# Patient Record
Sex: Male | Born: 1990 | Hispanic: No | Marital: Single | Smoking: Never smoker
Health system: Southern US, Community
[De-identification: ages and names within clinical notes are randomized; demographics above are authoritative.]

## PROBLEM LIST (undated history)

## (undated) DIAGNOSIS — J45909 Unspecified asthma, uncomplicated: Secondary | ICD-10-CM

## (undated) DIAGNOSIS — M109 Gout, unspecified: Secondary | ICD-10-CM

---
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
## 2017-01-05 ENCOUNTER — Encounter: Payer: Self-pay | Admitting: Emergency Medicine

## 2017-01-05 ENCOUNTER — Emergency Department (INDEPENDENT_AMBULATORY_CARE_PROVIDER_SITE_OTHER)
Admission: EM | Admit: 2017-01-05 | Discharge: 2017-01-05 | Disposition: A | Discharge status: Home / Self Care | Payer: Managed Care, Other (non HMO) | Source: Home / Self Care | Attending: Emergency Medicine | Admitting: Emergency Medicine

## 2017-01-05 ENCOUNTER — Other Ambulatory Visit: Payer: Self-pay

## 2017-01-05 ENCOUNTER — Emergency Department (INDEPENDENT_AMBULATORY_CARE_PROVIDER_SITE_OTHER): Payer: Managed Care, Other (non HMO)

## 2017-01-05 DIAGNOSIS — M25562 Pain in left knee: Secondary | ICD-10-CM

## 2017-01-05 LAB — POCT CBC W AUTO DIFF (K'VILLE URGENT CARE)

## 2017-01-05 MED ORDER — COLCHICINE 0.6 MG PO TABS: ORAL_TABLET | ORAL | 0 refills | Status: DC

## 2017-01-05 MED ORDER — KETOROLAC TROMETHAMINE 60 MG/2ML IM SOLN: 60.0000 mg | Freq: Once | INTRAMUSCULAR | Status: DC

## 2017-01-05 MED ORDER — HYDROCODONE-ACETAMINOPHEN 5-325 MG PO TABS: 1.0000 | ORAL_TABLET | Freq: Four times a day (QID) | ORAL | 0 refills | Status: DC | PRN

## 2017-01-05 NOTE — Discharge Instructions (Signed)
Please make an appointment to follow-up with Dr. Karie Schwalbe. Take medications as instructed. Only take the prescribed medicine colchicine. You can also take 2 extra strength Tylenol every 8 hours. Do not take the ibuprofen at present.

## 2017-01-05 NOTE — ED Provider Notes (Addendum)
[facility]    CSN: [id number] Arrival date & time: 01/05/17  1136     History   Chief Complaint Chief Complaint  Patient presents with  . Knee Pain    HPI Theodore Larson is a 26 y.o. male.   HPI Patient presents with severe pain in his left knee. He has pain with any movement of the knee. He is not ill. He does not have any fevers chills or other symptoms. He had a similar episode about one month ago. This resolved with topical cream and anti-inflammatories. History reviewed. No pertinent past medical history.  There are no active problems to display for this patient.   History reviewed. No pertinent surgical history.     Home Medications    Prior to Admission medications   Medication Sig Start Date End Date Taking? Authorizing Provider  colchicine 0.6 MG tablet Take 1 tablet 3 times a day for the first 2 days then 1 tablet twice daily for a total of 7 days 01/05/17   Collene Gobbleaub, Blaise Grieshaber A, MD  HYDROcodone-acetaminophen (NORCO) 5-325 MG tablet Take 1 tablet by mouth every 6 (six) hours as needed. 01/05/17   Collene Gobbleaub, Ciarra Braddy A, MD    Family History History reviewed. No pertinent family history.  Social History Social History   Tobacco Use  . Smoking status: Never Smoker  . Smokeless tobacco: Never Used  Substance Use Topics  . Alcohol use: No    Frequency: Never  . Drug use: Not on file     Allergies   Patient has no known allergies.   Review of Systems Review of Systems  Constitutional: Negative for fever.  Musculoskeletal: Positive for gait problem and joint swelling.       He has severe pain in his left knee unable to bend his knee.     Physical Exam Triage Vital Signs ED Triage Vitals  Enc Vitals Group     BP 01/05/17 1225 137/77     Pulse Rate 01/05/17 1225 (!) 101     Resp 01/05/17 1225 (!) 1     Temp 01/05/17 1225 97.9 F (36.6 C)     Temp Source 01/05/17 1225 Oral     SpO2 01/05/17 1225 97 %     Weight 01/05/17 1226 226 lb  (102.5 kg)     Height 01/05/17 1226 5\' 10"  (1.778 m)     Head Circumference --      Peak Flow --      Pain Score 01/05/17 1226 8     Pain Loc --      Pain Edu? --      Excl. in GC? --    No data found.  Updated Vital Signs BP 137/77 (BP Location: Right Arm)   Pulse (!) 101   Temp 97.9 F (36.6 C) (Oral)   Resp 16   Ht 5\' 10"  (1.778 m)   Wt 226 lb (102.5 kg)   SpO2 97%   BMI 32.43 kg/m   Visual Acuity Right Eye Distance:   Left Eye Distance:   Bilateral Distance:    Right Eye Near:   Left Eye Near:    Bilateral Near:     Physical Exam  Musculoskeletal:  The thigh exam is normal. The calf exam is normal. There is severe discomfort over the knee in all areas. There is minimal increase in warmth and no redness noted.     UC Treatments / Results  Labs (all labs ordered are listed, but only  abnormal results are displayed) Labs Reviewed  URIC ACID  COMPLETE METABOLIC PANEL WITH GFR  POCT CBC W AUTO DIFF ([facility])    EKG  EKG Interpretation None       Radiology Dg Knee 2 Views Left  Result Date: 01/05/2017 CLINICAL DATA:  Acute left knee pain without known injury. EXAM: LEFT KNEE - 1-2 VIEW COMPARISON:  None. FINDINGS: No evidence of fracture, dislocation, or joint effusion. No evidence of arthropathy or other focal bone abnormality. Soft tissues are unremarkable. IMPRESSION: Normal left knee. Electronically Signed   By: Lupita RaiderJames  Green Jr, M.D.   On: 01/05/2017 12:32    Procedures Procedures (including critical care time)  Medications Ordered in UC Medications - No data to display   Initial Impression / Assessment and Plan / UC Course  I have reviewed the triage vital signs and the nursing notes.  Pertinent labs & imaging results that were available during my care of the patient were reviewed by me and considered in my medical decision making (see chart for details). Patient has an acute left knee mono arthropathy. I suspect this is gout. Uric  acid was done. He will follow-up with Dr. Karie Schwalbe for needle aspiration to get a clear diagnosis. I did look up patient on the drug data registry. I did give him #16 hydrocodone to take for pain he will follow-up with Dr. Thereasa Distance4 1 diagnostic aspiration. Hopefully the colchicine will calm down his inflammation. [facility] did [facility] and patient has not received any prescriptions for pain medications to last 3 years his white count is 12,600. 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 He does not have fever and had the same illness one month ago which responded to nonsteroidals. We should have his uric acid back tomorrow which will be helpful in diagnosis. If this is completely normal on need to arrange tapping of his knee..      Final Clinical Impressions(s) / UC Diagnoses   Final diagnoses:  Acute pain of left knee    ED Discharge Orders        Ordered    colchicine 0.6 MG tablet     01/05/17 1257    HYDROcodone-acetaminophen (NORCO) 5-325 MG tablet  Every 6 hours PRN     01/05/17 1318       Controlled Substance Prescriptions [place] Controlled Substance Registry consulted? Not Applicable   Collene Gobbleaub, Porsha Skilton A, MD 01/05/17 1355    Collene Gobbleaub, Letisha Yera A, MD 01/05/17 1430

## 2017-01-05 NOTE — ED Triage Notes (Signed)
Patient reports sudden onset of left knee pain and edema this morning making it difficult to get out of bed; excruitiating pain unless leg is straight; took ibuprofen 800 mg po at 10:40am.

## 2017-01-06 ENCOUNTER — Telehealth: Payer: Self-pay | Admitting: Emergency Medicine

## 2017-01-06 LAB — URIC ACID: URIC ACID, SERUM: 11.2 mg/dL — AB (ref 4.0–8.0)

## 2017-01-06 LAB — COMPLETE METABOLIC PANEL WITH GFR
AG Ratio: 1.2 (calc) (ref 1.0–2.5)
ALBUMIN MSPROF: 4.2 g/dL (ref 3.6–5.1)
ALKALINE PHOSPHATASE (APISO): 75 U/L (ref 40–115)
ALT: 14 U/L (ref 9–46)
AST: 18 U/L (ref 10–40)
BUN: 10 mg/dL (ref 7–25)
CHLORIDE: 105 mmol/L (ref 98–110)
CO2: 24 mmol/L (ref 20–32)
CREATININE: 1.01 mg/dL (ref 0.60–1.35)
Calcium: 9.4 mg/dL (ref 8.6–10.3)
GFR, Est African American: 119 mL/min/{1.73_m2} (ref >=60)
GFR, Est Non African American: 103 mL/min/{1.73_m2} (ref >=60)
GLUCOSE: 111 mg/dL — AB (ref 65–99)
Globulin: 3.4 g/dL (calc) (ref 1.9–3.7)
Potassium: 4.6 mmol/L (ref 3.5–5.3)
Sodium: 141 mmol/L (ref 135–146)
TOTAL PROTEIN: 7.6 g/dL (ref 6.1–8.1)
Total Bilirubin: 0.7 mg/dL (ref 0.2–1.2)

## 2017-01-06 NOTE — Telephone Encounter (Signed)
Spoke with patient who states pain is somewhat better; taking all rxs appropriately; edema still present making movement still uncomfortable; denies fever. Gave him uric acid and dx confirmation per Dr.Daub. Will get him appt with Sports Med for 01-09-17 and call him back; encouraged him to return to UC if feeling worse or not improving over next 36 hours. pk

## 2017-01-06 NOTE — Telephone Encounter (Signed)
Call to patient; he is on schedule for Dr.Corey 01/14/17 but told him to call UC if edema does not resolve and it might be possible to pull sports med md over to see him here.pk

## 2017-01-08 ENCOUNTER — Other Ambulatory Visit: Payer: Self-pay

## 2017-01-08 ENCOUNTER — Emergency Department (HOSPITAL_BASED_OUTPATIENT_CLINIC_OR_DEPARTMENT_OTHER)
Admission: EM | Admit: 2017-01-08 | Discharge: 2017-01-08 | Disposition: A | Discharge status: Home / Self Care | Payer: Managed Care, Other (non HMO) | Attending: Emergency Medicine | Admitting: Emergency Medicine

## 2017-01-08 ENCOUNTER — Encounter (HOSPITAL_BASED_OUTPATIENT_CLINIC_OR_DEPARTMENT_OTHER): Payer: Self-pay | Admitting: Emergency Medicine

## 2017-01-08 DIAGNOSIS — M25562 Pain in left knee: Secondary | ICD-10-CM

## 2017-01-08 DIAGNOSIS — M109 Gout, unspecified: Secondary | ICD-10-CM | POA: Diagnosis not present

## 2017-01-08 HISTORY — DX: Unspecified asthma, uncomplicated: J45.909

## 2017-01-08 HISTORY — DX: Gout, unspecified: M10.9

## 2017-01-08 MED ORDER — INDOMETHACIN 50 MG PO CAPS: 50.0000 mg | ORAL_CAPSULE | Freq: Two times a day (BID) | ORAL | 0 refills | Status: DC

## 2017-01-08 MED ORDER — OXYCODONE-ACETAMINOPHEN 5-325 MG PO TABS
1.0000 | ORAL_TABLET | Freq: Once | ORAL | Status: AC
  Administered 2017-01-08: 1 via ORAL
  Filled 2017-01-08: qty 1

## 2017-01-08 MED ORDER — KETOROLAC TROMETHAMINE 30 MG/ML IJ SOLN
30.0000 mg | Freq: Once | INTRAMUSCULAR | Status: AC
  Administered 2017-01-08: 30 mg via INTRAMUSCULAR
  Filled 2017-01-08: qty 1

## 2017-01-08 NOTE — ED Notes (Signed)
ED Provider at bedside. 

## 2017-01-08 NOTE — ED Triage Notes (Signed)
Pain in left knee without injury.  Seen at UC 3 days ago and diagnosed with gout.  Given crutches and meds.  Pt sts pain is a little better but swelling and movement of knee is worse.

## 2017-01-08 NOTE — ED Provider Notes (Signed)
[facility] EMERGENCY DEPARTMENT Provider Note   CSN: [id number] Arrival date & time: 01/08/17  0041     History   Chief Complaint Chief Complaint  Patient presents with  . Knee Pain    HPI Theodore Larson is a 26 y.o. male.  HPI  This is a 26 year old male who presents with left knee pain.  He was seen and evaluated 3 days ago at urgent care.  At that time gout was suspected.  He was treated with colchicine and hydrocodone.  Uric acid level was noted to be elevated at 11 which corroborated this diagnosis.  Patient states that he has had some improvement of pain but has noted persistent swelling and continues to have significant pain with range of motion and weightbearing.  He is due to follow-up on December 31 with sports medicine doctor.  With range of motion, he rates his pain at 10 out of 10.  Denies if he significant pork or beer in his diet.  Patient denies infectious symptoms including fevers.  Ports compliance with his medications.  Past Medical History:  Diagnosis Date  . Asthma   . Gout     There are no active problems to display for this patient.   History reviewed. No pertinent surgical history.     Home Medications    Prior to Admission medications   Medication Sig Start Date End Date Taking? Authorizing Provider  HYDROcodone-acetaminophen (NORCO) 5-325 MG tablet Take 1 tablet by mouth every 6 (six) hours as needed. 01/05/17   Collene Gobbleaub, Steven A, MD  indomethacin (INDOCIN) 50 MG capsule Take 1 capsule (50 mg total) by mouth 2 (two) times daily with a meal. 01/08/17   Laurey Salser, Mayer Maskerourtney F, MD    Family History No family history on file.  Social History Social History   Tobacco Use  . Smoking status: Never Smoker  . Smokeless tobacco: Never Used  Substance Use Topics  . Alcohol use: No    Frequency: Never  . Drug use: No     Allergies   Patient has no known allergies.   Review of Systems Review of Systems  Constitutional: Negative  for fever.  Musculoskeletal:       Knee pain  Skin: Negative for color change.  All other systems reviewed and are negative.    Physical Exam Updated Vital Signs BP 126/89 (BP Location: Right Arm)   Pulse 95   Temp 98.2 F (36.8 C) (Oral)   Resp 16   Ht 5\' 10"  (1.778 m)   Wt 102.5 kg (226 lb)   SpO2 96%   BMI 32.43 kg/m   Physical Exam  Constitutional: He appears well-developed and well-nourished.  HENT:  Head: Normocephalic and atraumatic.  Cardiovascular: Normal rate and regular rhythm.  Pulmonary/Chest: Effort normal. No respiratory distress.  Musculoskeletal: He exhibits edema.  Swelling diffusely over the left knee, no overlying skin changes or erythema, no joint line tenderness, patient very resistant to range of motion of the left knee, 2+ DP pulse  Nursing note and vitals reviewed.    ED Treatments / Results  Labs (all labs ordered are listed, but only abnormal results are displayed) Labs Reviewed - No data to display  EKG  EKG Interpretation None       Radiology No results found.  Procedures Procedures (including critical care time)  Medications Ordered in ED Medications  ketorolac (TORADOL) 30 MG/ML injection 30 mg (30 mg Intramuscular Given 01/08/17 0113)  oxyCODONE-acetaminophen (PERCOCET/ROXICET) 5-325 MG per tablet  1 tablet (1 tablet Oral Given 01/08/17 0115)     Initial Impression / Assessment and Plan / ED Course  I have reviewed the triage vital signs and the nursing notes.  Pertinent labs & imaging results that were available during my care of the patient were reviewed by me and considered in my medical decision making (see chart for details).    Patient presents with continued pain left knee.  Currently taking colchicine and hydrocodone.  Last hydrocodone was 18 hours ago.  No signs or symptoms of infection.  He does have resistance to range of motion.  Uric acid level was noted to be 11.  Suspect gout is the cause.  Low suspicion at  this time for septic arthropathy.  Patient was given Toradol and Percocet.  On repeat exam, range of motion much improved with less pain.  Recommend more aggressive pain control and range of motion exercises.  Patient does not feel that colchicine is working.  Will switch to indomethacin.  He has no contra indications for anti-inflammatories.  Follow-up as scheduled.  If he develops fevers or erythema at the site, he needs to be reevaluated.  After history, exam, and medical workup I feel the patient has been appropriately medically screened and is safe for discharge home. Pertinent diagnoses were discussed with the patient. Patient was given return precautions.   Final Clinical Impressions(s) / ED Diagnoses   Final diagnoses:  Acute pain of left knee  Acute gout of left knee, unspecified cause    ED Discharge Orders        Ordered    indomethacin (INDOCIN) 50 MG capsule  2 times daily with meals     01/08/17 0144       Ashlyn Cabler, Mayer Maskerourtney F, MD 01/08/17 [id number]

## 2017-01-08 NOTE — Discharge Instructions (Signed)
Return for any worsening symptoms, fevers, redness of the left knee.

## 2017-01-14 ENCOUNTER — Encounter: Payer: Managed Care, Other (non HMO) | Admitting: Family Medicine

## 2017-01-14 DIAGNOSIS — Z0189 Encounter for other specified special examinations: Secondary | ICD-10-CM

## 2017-02-10 ENCOUNTER — Emergency Department (INDEPENDENT_AMBULATORY_CARE_PROVIDER_SITE_OTHER): Payer: Managed Care, Other (non HMO)

## 2017-02-10 ENCOUNTER — Emergency Department (INDEPENDENT_AMBULATORY_CARE_PROVIDER_SITE_OTHER)
Admission: EM | Admit: 2017-02-10 | Discharge: 2017-02-10 | Disposition: A | Discharge status: Home / Self Care | Payer: Managed Care, Other (non HMO) | Source: Home / Self Care | Attending: Family Medicine | Admitting: Family Medicine

## 2017-02-10 ENCOUNTER — Encounter: Payer: Self-pay | Admitting: Emergency Medicine

## 2017-02-10 DIAGNOSIS — M79671 Pain in right foot: Secondary | ICD-10-CM | POA: Diagnosis not present

## 2017-02-10 MED ORDER — MELOXICAM 15 MG PO TABS: 15.0000 mg | ORAL_TABLET | Freq: Every day | ORAL | 0 refills | Status: DC

## 2017-02-10 NOTE — Discharge Instructions (Signed)
°  Meloxicam (Mobic) is an antiinflammatory to help with pain and inflammation.  Do not take ibuprofen, Advil, Aleve, or any other medications that contain NSAIDs while taking meloxicam as this may cause stomach upset or even ulcers if taken in large amounts for an extended period of time.  ° °

## 2017-02-10 NOTE — ED Triage Notes (Signed)
Patient complaining of right foot pain x 2 days, does have a history of gout in left knee so he is unsure if it is gout or not.

## 2017-02-10 NOTE — ED Provider Notes (Signed)
[facility]    CSN: [id number] Arrival date & time: 02/10/17  1445     History   Chief Complaint Chief Complaint  Patient presents with  . Foot Pain    HPI Theodore Larson is a 27 y.o. male.   HPI Theodore Larson is a 27 y.o. male presenting to UC with c/o Right lateral foot pain that started yesterday.  Pain is aching and sore, worse with weight bearing and movement of foot.  Pain is 8/10 at worst.  He took Advil last night before bed. He was able to sleep through the night. He has not taken anything for pain today.  Denies known injury but he has had gout several times in his knees.  He is unsure if gout is the reason for current pain.  He was encouraged to f/u with Sports Medicine in the past but never did because his knee pain resolved with indomethacin.    Past Medical History:  Diagnosis Date  . Asthma   . Gout     There are no active problems to display for this patient.   History reviewed. No pertinent surgical history.     Home Medications    Prior to Admission medications   Medication Sig Start Date End Date Taking? Authorizing Provider  indomethacin (INDOCIN) 50 MG capsule Take 1 capsule (50 mg total) by mouth 2 (two) times daily with a meal. 01/08/17   Horton, Mayer Masker, MD  meloxicam (MOBIC) 15 MG tablet Take 1 tablet (15 mg total) by mouth daily. 02/10/17   Lurene Shadow, PA-C    Family History No family history on file.  Social History Social History   Tobacco Use  . Smoking status: Never Smoker  . Smokeless tobacco: Never Used  Substance Use Topics  . Alcohol use: No    Frequency: Never  . Drug use: No     Allergies   Patient has no known allergies.   Review of Systems Review of Systems  Musculoskeletal: Positive for arthralgias, gait problem (increased pain in Right foot) and myalgias. Negative for joint swelling.  Skin: Negative for color change and wound.  Neurological: Negative for weakness and numbness.      Physical Exam Triage Vital Signs ED Triage Vitals [02/10/17 1521]  Enc Vitals Group     BP 121/82     Pulse Rate (!) 104     Resp      Temp (!) 97.5 F (36.4 C)     Temp Source Oral     SpO2 96 %     Weight 232 lb 4 oz (105.3 kg)     Height 5\' 11"  (1.803 m)     Head Circumference      Peak Flow      Pain Score 8     Pain Loc      Pain Edu?      Excl. in GC?    No data found.  Updated Vital Signs BP 121/82 (BP Location: Right Arm)   Pulse (!) 104   Temp (!) 97.5 F (36.4 C) (Oral)   Ht 5\' 11"  (1.803 m)   Wt 232 lb 4 oz (105.3 kg)   SpO2 96%   BMI 32.39 kg/m  Physical Exam  Constitutional: He is oriented to person, place, and time. He appears well-developed and well-nourished. No distress.  HENT:  Head: Normocephalic and atraumatic.  Eyes: EOM are normal.  Neck: Normal range of motion.  Cardiovascular: Normal rate.  Pulses:  Dorsalis pedis pulses are 2+ on the right side.       Posterior tibial pulses are 2+ on the right side.  Pulmonary/Chest: Effort normal.  Musculoskeletal: Normal range of motion. He exhibits tenderness. He exhibits no edema.  Right foot: no edema or deformity.  Tenderness over proximal aspect of 5th metatarsal. No crepitus.  Full ROM ankle and toes.   Neurological: He is alert and oriented to person, place, and time.  Skin: Skin is warm and dry. Capillary refill takes less than 2 seconds. He is not diaphoretic. No erythema.  Right foot: skin in tact. No ecchymosis or erythema.   Psychiatric: He has a normal mood and affect. His behavior is normal.  Nursing note and vitals reviewed.    UC Treatments / Results  Labs (all labs ordered are listed, but only abnormal results are displayed) Labs Reviewed - No data to display  EKG  EKG Interpretation None       Radiology Dg Foot Complete Right  Result Date: 02/10/2017 CLINICAL DATA:  RIGHT foot pain for 2 days laterally near the base of fifth metatarsal, history of gout EXAM:  RIGHT FOOT COMPLETE - 3+ VIEW COMPARISON:  None FINDINGS: Osseous mineralization normal. Joint spaces preserved. No fracture, dislocation, or bone destruction. No erosive or inflammatory changes. IMPRESSION: Normal exam. Electronically Signed   By: Ulyses SouthwardMark  Boles M.D.   On: 02/10/2017 16:30    Procedures Procedures (including critical care time)  Medications Ordered in UC Medications - No data to display   Initial Impression / Assessment and Plan / UC Course  I have reviewed the triage vital signs and the nursing notes.  Pertinent labs & imaging results that were available during my care of the patient were reviewed by me and considered in my medical decision making (see chart for details).     Hx and exam c/w overuse or possible early plantar fascitis  Encouraged rest, ice, compression and elevation May take Melixocam and try home exercises for pain relief Encouraged f/u with Sports Medicine in 1-2 weeks for recheck of symptoms. He may benefit from custom orthotics.   Final Clinical Impressions(s) / UC Diagnoses   Final diagnoses:  Right foot pain    ED Discharge Orders        Ordered    meloxicam (MOBIC) 15 MG tablet  Daily     02/10/17 1650       Controlled Substance Prescriptions [place] Controlled Substance Registry consulted? Not Applicable   Rolla Platehelps, Cheetara Hoge O, PA-C 02/10/17 [phone]

## 2018-02-08 ENCOUNTER — Other Ambulatory Visit: Payer: Self-pay

## 2018-02-08 ENCOUNTER — Emergency Department (INDEPENDENT_AMBULATORY_CARE_PROVIDER_SITE_OTHER)
Admission: EM | Admit: 2018-02-08 | Discharge: 2018-02-08 | Disposition: A | Discharge status: Home / Self Care | Payer: Managed Care, Other (non HMO) | Source: Home / Self Care | Attending: Emergency Medicine | Admitting: Emergency Medicine

## 2018-02-08 DIAGNOSIS — R739 Hyperglycemia, unspecified: Secondary | ICD-10-CM

## 2018-02-08 DIAGNOSIS — M10062 Idiopathic gout, left knee: Secondary | ICD-10-CM

## 2018-02-08 DIAGNOSIS — M25562 Pain in left knee: Secondary | ICD-10-CM

## 2018-02-08 LAB — POCT FASTING CBG KUC MANUAL ENTRY: POCT GLUCOSE (MANUAL ENTRY) KUC: 128 mg/dL — AB (ref 70–99)

## 2018-02-08 MED ORDER — HYDROCODONE-ACETAMINOPHEN 5-325 MG PO TABS: 1.0000 | ORAL_TABLET | Freq: Four times a day (QID) | ORAL | 0 refills | Status: DC | PRN

## 2018-02-08 MED ORDER — INDOMETHACIN 50 MG PO CAPS: 50.0000 mg | ORAL_CAPSULE | Freq: Two times a day (BID) | ORAL | 0 refills | Status: AC

## 2018-02-08 NOTE — Discharge Instructions (Signed)
Take indomethacin as instructed. You have indomethacin to take 1 tablet every 8 hours with food. Call Dr. Melvia Heaps office and get an appointment to be seen by Dr. Denyse Amass or Dr. Karie Schwalbe on Monday. You have a note to be out of work until next Wednesday which will give you time to be seen. If you develop high fever or chills associated with your knee plain or develop redness in your knee please be seen in the emergency room.

## 2018-02-08 NOTE — ED Provider Notes (Addendum)
[facility]    CSN: [id number] Arrival date & time: 02/08/18  1007     History   Chief Complaint Chief Complaint  Patient presents with  . Knee Pain    HPI Theodore Larson is a 28 y.o. male.   HPI  Past Medical History:  Diagnosis Date  . Asthma   . Gout   Patient states he went to the gym and did the elliptical on Tuesday.  On Wednesday he noted pain and swelling of the left knee.  Since that time he has had progressive swelling to where he is not currently able to walk without the assistance of crutches.  Of note patient had a uric acid done a year ago which was 11.2.  He has taken nonsteroidals in the past such as Mobic and indomethacin. He has taken colchicine in the past. He has not had any follow-up with sports medicine or a primary care physician regarding these problems.  He did have a mildly elevated random sugar of 111 previously this also has not been followed up.  There are no active problems to display for this patient.   History reviewed. No pertinent surgical history.     Home Medications    Prior to Admission medications   Medication Sig Start Date End Date Taking? Authorizing Provider  HYDROcodone-acetaminophen (NORCO) 5-325 MG tablet Take 1 tablet by mouth every 6 (six) hours as needed. 02/08/18   Collene Gobble, MD  indomethacin (INDOCIN) 50 MG capsule Take 1 capsule (50 mg total) by mouth 2 (two) times daily with a meal. 02/08/18   Renu Asby, Maylon Peppers, MD    Family History History reviewed. No pertinent family history.  Social History Social History   Tobacco Use  . Smoking status: Never Smoker  . Smokeless tobacco: Never Used  Substance Use Topics  . Alcohol use: No    Frequency: Never  . Drug use: No     Allergies   Patient has no known allergies.   Review of Systems Review of Systems  Constitutional:       Patient states he is fatigued all the time.  He does not sleep well at night due to snoring and according to his  partner has had episodes of sleep apnea.     Physical Exam Triage Vital Signs ED Triage Vitals  Enc Vitals Group     BP 02/08/18 1032 129/85     Pulse Rate 02/08/18 1032 (!) 115     Resp 02/08/18 1032 18     Temp 02/08/18 1032 97.6 F (36.4 C)     Temp Source 02/08/18 1032 Oral     SpO2 02/08/18 1032 96 %     Weight 02/08/18 1033 232 lb (105.2 kg)     Height 02/08/18 1033 5\' 10"  (1.778 m)     Head Circumference --      Peak Flow --      Pain Score 02/08/18 1033 5     Pain Loc --      Pain Edu? --      Excl. in GC? --    No data found.  Updated Vital Signs BP 129/85 (BP Location: Right Arm)   Pulse (!) 115   Temp 97.6 F (36.4 C) (Oral)   Resp 18   Ht 5\' 10"  (1.778 m)   Wt 105.2 kg   SpO2 96%   BMI 33.29 kg/m   Visual Acuity Right Eye Distance:   Left Eye Distance:   Bilateral  Distance:    Right Eye Near:   Left Eye Near:    Bilateral Near:     Physical Exam Constitutional:      Comments: Patient with crutches and a knee sleeve on apparently in pain from his left knee.  Neck:     Musculoskeletal: Normal range of motion.  Cardiovascular:     Rate and Rhythm: Normal rate and regular rhythm.  Pulmonary:     Effort: Pulmonary effort is normal.     Breath sounds: Normal breath sounds.  Musculoskeletal:     Comments: There is diffuse swelling over the left knee.  There is pain with flexion and extension.  There is mild increase in warmth but no redness over the knee.      UC Treatments / Results  Labs (all labs ordered are listed, but only abnormal results are displayed) Labs Reviewed  POCT FASTING CBG [facility] MANUAL ENTRY - Abnormal; Notable for the following components:      Result Value   POCT Glucose ([facility]) 128 (*)    All other components within normal limits    EKG None  Radiology No results found.  Procedures Procedures (including critical care time)  Medications Ordered in UC Medications - No data to display  Initial Impression /  Assessment and Plan / UC Course  I have reviewed the triage vital signs and the nursing notes.  Pertinent labs & imaging results that were available during my care of the patient were reviewed by me and considered in my medical decision making (see chart for details). We will proceed with evaluation by sports medicine.  He had an appointment previously and was not able to keep that appointment.  He will be on Indocin and hydrocodone for pain.  He needs evaluation for sleep apnea as well as thyroid testing.  He was given a handout regarding management of gout.  Random sugar in our office today was elevated at 122.  This patient had a previous appointment with Dr. Denyse Amass and did not go.  I told him how important it is to follow-up and that he could get on medication to prevent him from having acute gout attacks.      Final Clinical Impressions(s) / UC Diagnoses   Final diagnoses:  Hyperglycemia  Acute pain of left knee  Acute idiopathic gout of left knee     Discharge Instructions     Take indomethacin as instructed. You have indomethacin to take 1 tablet every 8 hours with food. Call Dr. Melvia Heaps office and get an appointment to be seen by Dr. Denyse Amass or Dr. Karie Schwalbe on Monday. You have a note to be out of work until next Wednesday which will give you time to be seen. If you develop high fever or chills associated with your knee plain or develop redness in your knee please be seen in the emergency room.    ED Prescriptions    Medication Sig Dispense Auth. Provider   HYDROcodone-acetaminophen (NORCO) 5-325 MG tablet  (Status: Discontinued) Take 1 tablet by mouth every 6 (six) hours as needed. 30 tablet Collene Gobble, MD   indomethacin (INDOCIN) 50 MG capsule Take 1 capsule (50 mg total) by mouth 2 (two) times daily with a meal. 10 capsule Collene Gobble, MD   HYDROcodone-acetaminophen (NORCO) 5-325 MG tablet Take 1 tablet by mouth every 6 (six) hours as needed. 10 tablet Collene Gobble, MD      Controlled Substance Prescriptions  Controlled Substance Registry consulted? Not Applicable  Collene Gobbleaub, Hamid Brookens A, MD 02/08/18 1559    Collene Gobbleaub, Olando Willems A, MD 02/08/18 [phone]

## 2018-02-08 NOTE — ED Triage Notes (Signed)
Worked out Tuesday, Wednesday started with left knee pain, Thursday much worse with swelling.  Also has had gout in the past, and not sure if it is a strain or gout.

## 2018-02-24 ENCOUNTER — Telehealth: Payer: Self-pay

## 2018-02-24 MED ORDER — HYDROCODONE-ACETAMINOPHEN 5-325 MG PO TABS: ORAL_TABLET | ORAL | 0 refills | Status: AC

## 2018-02-24 MED ORDER — COLCHICINE 0.6 MG PO TABS: ORAL_TABLET | ORAL | 0 refills | Status: AC

## 2018-02-24 NOTE — Telephone Encounter (Signed)
Patient request refill of gout meds for persistent knee pain. Will try colchicine.  Rx for Norco (#5, no refill).  Followup with PCP for gout management.

## 2018-02-24 NOTE — Telephone Encounter (Signed)
Spoke with patient that Dr Cathren HarshBeese has ordered medication and he needs to come to [facility] to pick up.

## 2018-10-27 IMAGING — DX DG KNEE 1-2V*L*
2 series · 2 of 2 positions shown · non-contrast
Comparison: None.

CLINICAL DATA: Acute left knee pain without known injury.

EXAM:
LEFT KNEE - 1-2 VIEW

[knee ap]
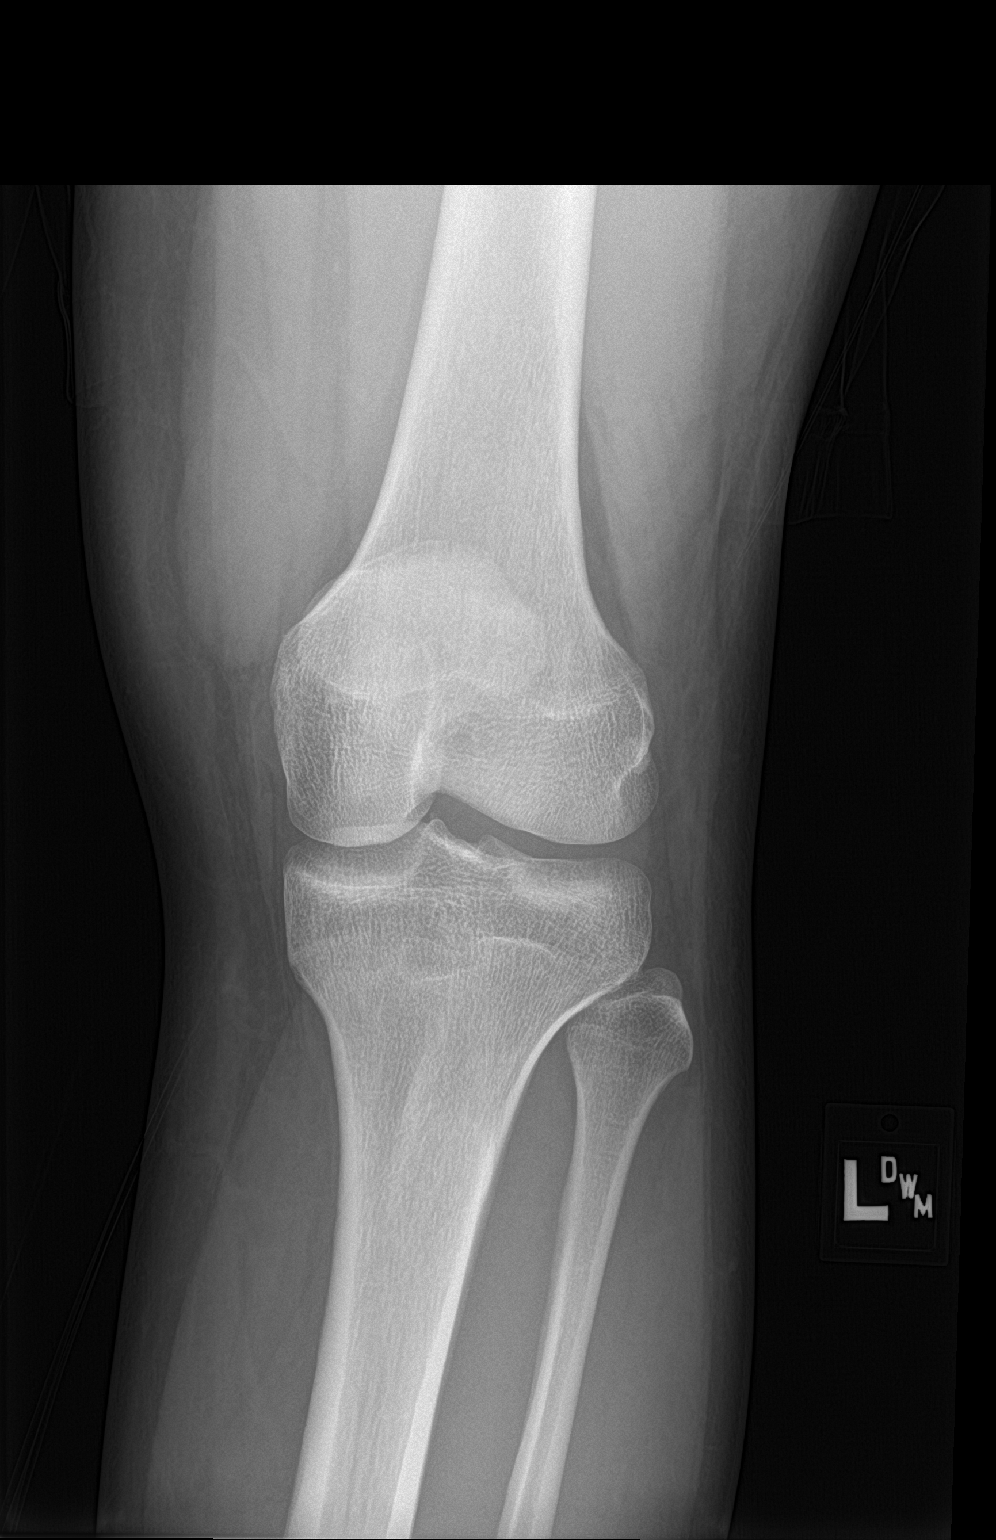

[knee lat]
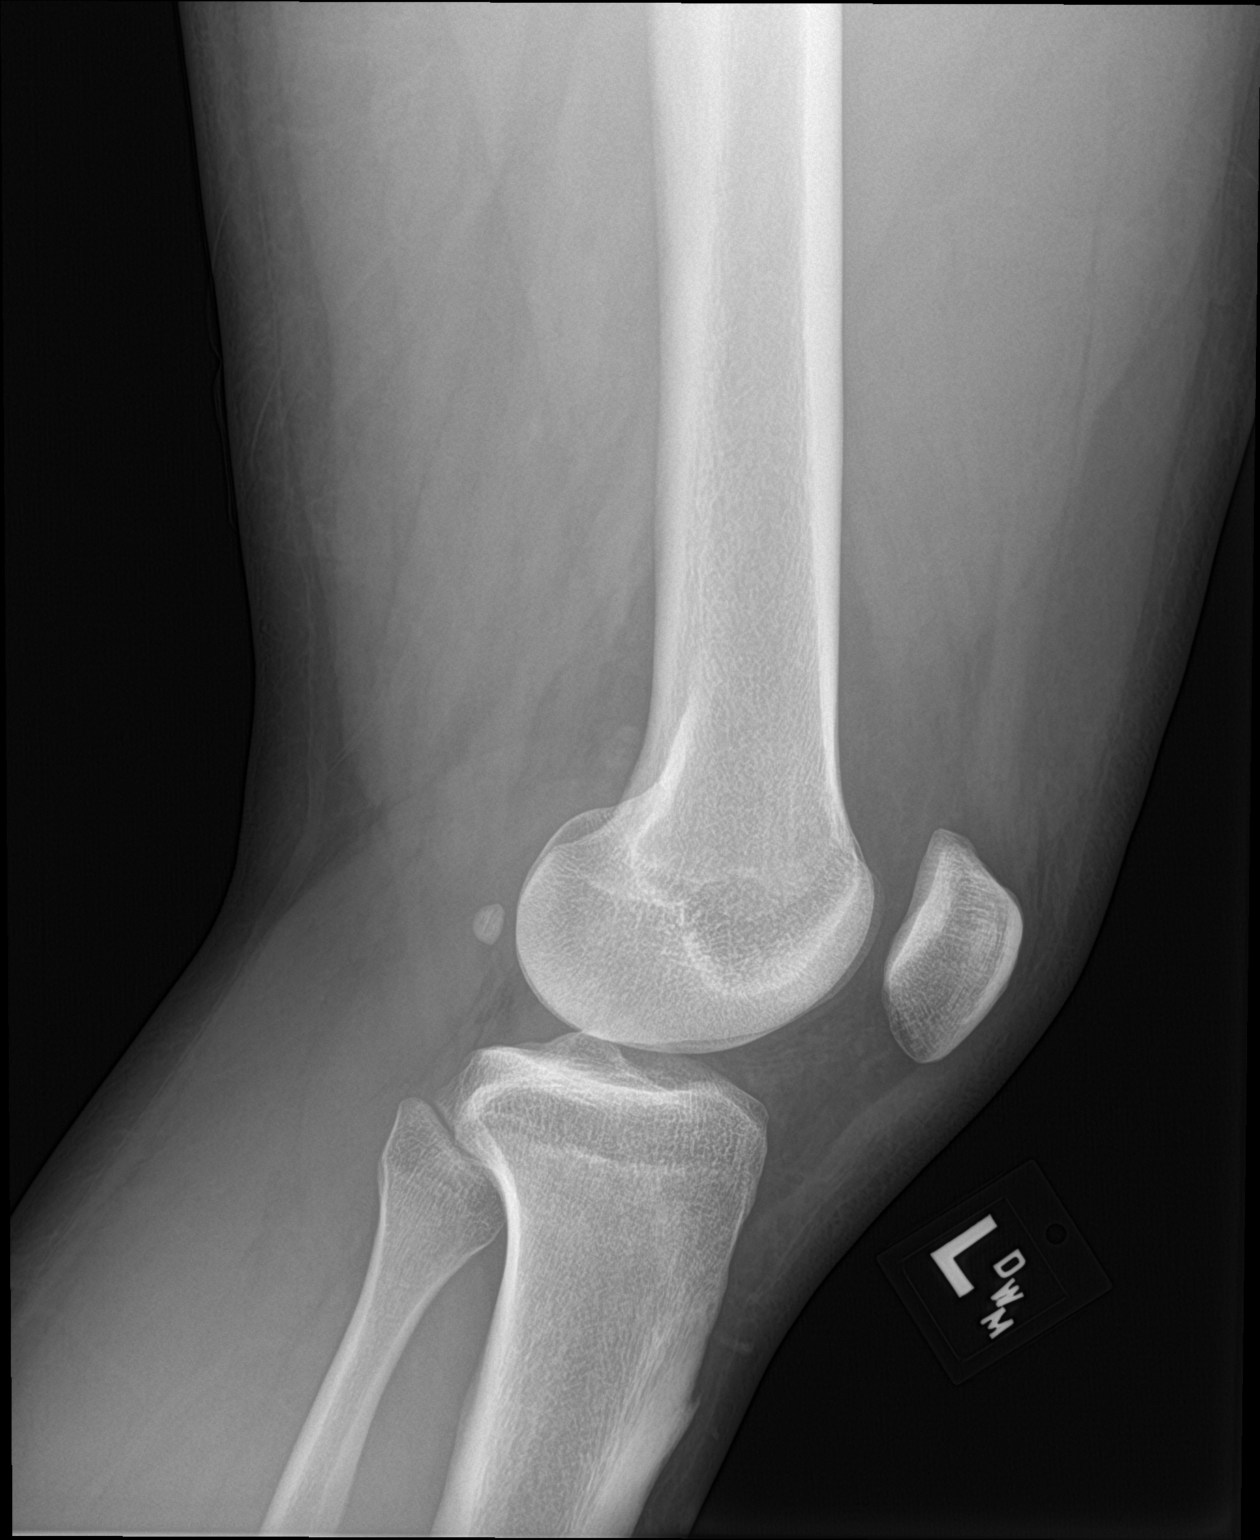

[2 of 2 positions shown; findings below may reference images not displayed]

FINDINGS: No evidence of fracture, dislocation, or joint effusion. No evidence
of arthropathy or other focal bone abnormality. Soft tissues are
unremarkable.
IMPRESSION: Normal left knee.

## 2020-12-13 ENCOUNTER — Emergency Department (HOSPITAL_BASED_OUTPATIENT_CLINIC_OR_DEPARTMENT_OTHER): Payer: Managed Care, Other (non HMO)

## 2020-12-13 ENCOUNTER — Encounter (HOSPITAL_BASED_OUTPATIENT_CLINIC_OR_DEPARTMENT_OTHER): Payer: Self-pay | Admitting: Emergency Medicine

## 2020-12-13 ENCOUNTER — Other Ambulatory Visit: Payer: Self-pay

## 2020-12-13 DIAGNOSIS — S93402A Sprain of unspecified ligament of left ankle, initial encounter: Secondary | ICD-10-CM | POA: Insufficient documentation

## 2020-12-13 DIAGNOSIS — Y92838 Other recreation area as the place of occurrence of the external cause: Secondary | ICD-10-CM | POA: Insufficient documentation

## 2020-12-13 DIAGNOSIS — J45909 Unspecified asthma, uncomplicated: Secondary | ICD-10-CM | POA: Diagnosis not present

## 2020-12-13 DIAGNOSIS — Y9366 Activity, soccer: Secondary | ICD-10-CM | POA: Diagnosis not present

## 2020-12-13 DIAGNOSIS — M25472 Effusion, left ankle: Secondary | ICD-10-CM | POA: Insufficient documentation

## 2020-12-13 DIAGNOSIS — S99912A Unspecified injury of left ankle, initial encounter: Secondary | ICD-10-CM | POA: Diagnosis present

## 2020-12-13 DIAGNOSIS — X501XXA Overexertion from prolonged static or awkward postures, initial encounter: Secondary | ICD-10-CM | POA: Diagnosis not present

## 2020-12-13 NOTE — ED Triage Notes (Signed)
Pt playing soccer, ankle rolled and "POPed" about  9:30 pm today.

## 2020-12-14 ENCOUNTER — Emergency Department (HOSPITAL_BASED_OUTPATIENT_CLINIC_OR_DEPARTMENT_OTHER)
Admission: EM | Admit: 2020-12-14 | Discharge: 2020-12-14 | Disposition: A | Discharge status: Home / Self Care | Payer: Managed Care, Other (non HMO) | Attending: Emergency Medicine | Admitting: Emergency Medicine

## 2020-12-14 ENCOUNTER — Encounter (HOSPITAL_BASED_OUTPATIENT_CLINIC_OR_DEPARTMENT_OTHER): Payer: Self-pay | Admitting: Emergency Medicine

## 2020-12-14 DIAGNOSIS — S93402A Sprain of unspecified ligament of left ankle, initial encounter: Secondary | ICD-10-CM

## 2020-12-14 DIAGNOSIS — M25472 Effusion, left ankle: Secondary | ICD-10-CM

## 2020-12-14 MED ORDER — DICLOFENAC SODIUM ER 100 MG PO TB24: 100.0000 mg | ORAL_TABLET | Freq: Every day | ORAL | 0 refills | Status: AC

## 2020-12-14 MED ORDER — KETOROLAC TROMETHAMINE 60 MG/2ML IM SOLN
60.0000 mg | Freq: Once | INTRAMUSCULAR | Status: DC
  Filled 2020-12-14: qty 2

## 2020-12-14 NOTE — ED Provider Notes (Addendum)
[facility] EMERGENCY DEPARTMENT Provider Note   CSN: [id number] Arrival date & time: 12/13/20  2328     History Chief Complaint  Patient presents with   Ankle Pain    Theodore Larson is a 30 y.o. male.  The history is provided by the patient.  Ankle Pain Location:  Ankle Time since incident:  6 hours Injury: yes   Mechanism of injury comment:  Twisted playing indoor soccer on astroturf Ankle location:  L ankle Pain details:    Quality:  Aching   Radiates to:  Does not radiate   Severity:  Moderate   Onset quality:  Sudden   Duration:  6 hours   Timing:  Constant   Progression:  Unchanged Chronicity:  New Dislocation: no   Foreign body present:  No foreign bodies Prior injury to area:  Yes Relieved by:  Nothing Worsened by:  Nothing Ineffective treatments:  Acetaminophen Associated symptoms: swelling   Associated symptoms: no back pain, no fever, no numbness, no stiffness and no tingling   Risk factors: no concern for non-accidental trauma       Past Medical History:  Diagnosis Date   Asthma    Gout     There are no problems to display for this patient.   History reviewed. No pertinent surgical history.     History reviewed. No pertinent family history.  Social History   Tobacco Use   Smoking status: Never   Smokeless tobacco: Never  Substance Use Topics   Alcohol use: No   Drug use: No    Home Medications Prior to Admission medications   Medication Sig Start Date End Date Taking? Authorizing Provider  Diclofenac Sodium CR 100 MG 24 hr tablet Take 1 tablet (100 mg total) by mouth daily. 12/14/20  Yes Sade Mehlhoff, MD  colchicine 0.6 MG tablet Take 2 tabs PO stat.  Take remaining tab one hour later. 02/24/18   Lattie Haw, MD  HYDROcodone-acetaminophen [facility]) 5-325 MG tablet Take one tab PO HS prn pain 02/24/18   Lattie Haw, MD  indomethacin (INDOCIN) 50 MG capsule Take 1 capsule (50 mg total) by mouth 2 (two) times  daily with a meal. 02/08/18   Daub, Maylon Peppers, MD    Allergies    Patient has no known allergies.  Review of Systems   Review of Systems  Constitutional:  Negative for fever.  Eyes:  Negative for redness.  Respiratory:  Negative for wheezing and stridor.   Cardiovascular:  Negative for palpitations.  Gastrointestinal:  Negative for abdominal pain.  Genitourinary:  Negative for difficulty urinating.  Musculoskeletal:  Positive for arthralgias. Negative for back pain and stiffness.  Skin:  Negative for rash.  Neurological:  Negative for facial asymmetry.  Psychiatric/Behavioral:  Negative for agitation.   All other systems reviewed and are negative.  Physical Exam Updated Vital Signs BP 99/70   Pulse 87   Temp 98.7 F (37.1 C) (Oral)   Resp 16   Ht 5\' 10"  (1.778 m)   Wt 98.9 kg   SpO2 97%   BMI 31.28 kg/m   Physical Exam Vitals and nursing note reviewed.  Constitutional:      General: He is not in acute distress.    Appearance: Normal appearance.  HENT:     Head: Normocephalic and atraumatic.     Nose: Nose normal.  Eyes:     Conjunctiva/sclera: Conjunctivae normal.     Pupils: Pupils are equal, round, and reactive to light.  Cardiovascular:     Rate and Rhythm: Normal rate and regular rhythm.     Pulses: Normal pulses.     Heart sounds: Normal heart sounds.  Pulmonary:     Effort: Pulmonary effort is normal.     Breath sounds: Normal breath sounds.  Abdominal:     General: Abdomen is flat. Bowel sounds are normal.     Palpations: Abdomen is soft.     Tenderness: There is no abdominal tenderness. There is no guarding.  Musculoskeletal:        General: Normal range of motion.     Cervical back: Normal range of motion and neck supple.     Left knee: Normal.     Left lower leg: Normal.     Right ankle: Normal.     Right Achilles Tendon: Normal.     Left ankle: Swelling present. No deformity or ecchymosis. Normal range of motion. Anterior drawer test negative.  Normal pulse.     Left Achilles Tendon: Normal.     Right foot: Normal.     Left foot: Normal. Normal range of motion and normal capillary refill. No bony tenderness. Normal pulse.       Legs:  Skin:    General: Skin is warm and dry.     Capillary Refill: Capillary refill takes less than 2 seconds.  Neurological:     General: No focal deficit present.     Mental Status: He is alert and oriented to person, place, and time.     Deep Tendon Reflexes: Reflexes normal.  Psychiatric:        Mood and Affect: Mood normal.        Behavior: Behavior normal.    ED Results / Procedures / Treatments   Labs (all labs ordered are listed, but only abnormal results are displayed) Labs Reviewed - No data to display  EKG None  Radiology DG Ankle Complete Left  Result Date: 12/14/2020 CLINICAL DATA:  Initial evaluation for acute ankle pain.  Injury. EXAM: LEFT ANKLE COMPLETE - 3+ VIEW COMPARISON:  None. FINDINGS: No acute fracture or dislocation. Ankle mortise approximated. Talar dome intact. Probable small joint effusion noted on lateral view. Mild soft tissue swelling about the ankle. Osseous mineralization normal. IMPRESSION: 1. No acute fracture or dislocation. 2. Small joint effusion with mild soft tissue swelling about the ankle. Electronically Signed   By: Rise Mu M.D.   On: 12/14/2020 00:24   DG Foot Complete Left  Result Date: 12/14/2020 CLINICAL DATA:  Initial evaluation for acute trauma, injury. EXAM: LEFT FOOT - COMPLETE 3+ VIEW COMPARISON:  None. FINDINGS: No acute fracture dislocation. Joint spaces maintained without degenerative or erosive arthropathy. Osseous mineralization normal. No appreciable soft tissue injury about the foot. IMPRESSION: No acute osseous abnormality about the foot. Electronically Signed   By: Rise Mu M.D.   On: 12/14/2020 00:26    Procedures Procedures   Medications Ordered in ED Medications  ketorolac (TORADOL) injection 60 mg (60  mg Intramuscular Patient Refused/Not Given 12/14/20 0319)    ED Course  I have reviewed the triage vital signs and the nursing notes.  Pertinent labs & imaging results that were available during my care of the patient were reviewed by me and considered in my medical decision making (see chart for details).    MDM Rules/Calculators/A&P                          Ice, elevation and  NSAIDs.  Fitted for ASO in the ED and already has crutches.  Ice for 20 minutes every 2 hours and wear a lace up shoe.  Follow up with orthopedics for ongoing care.   Final Clinical Impression(s) / ED Diagnoses Final diagnoses:  Sprain of left ankle, unspecified ligament, initial encounter  Ankle effusion, left  Return for intractable cough, coughing up blood, fevers > 100.4 unrelieved by medication, shortness of breath, intractable vomiting, chest pain, shortness of breath, weakness, numbness, changes in speech, facial asymmetry, abdominal pain, passing out, Inability to tolerate liquids or food, cough, altered mental status or any concerns. No signs of systemic illness or infection. The patient is nontoxic-appearing on exam and vital signs are within normal limits.  I have reviewed the triage vital signs and the nursing notes. Pertinent labs & imaging results that were available during my care of the patient were reviewed by me and considered in my medical decision making (see chart for details). After history, exam, and   Rx / DC Orders ED Discharge Orders          Ordered    Diclofenac Sodium CR 100 MG 24 hr tablet  Daily        12/14/20 0322               Jahnai Slingerland, MD 12/14/20 [phone]

## 2022-10-05 IMAGING — CR DG ANKLE COMPLETE 3+V*L*
3 series · 3 of 3 positions shown · non-contrast
Comparison: None.

CLINICAL DATA: Initial evaluation for acute ankle pain.  Injury.

EXAM:
LEFT ANKLE COMPLETE - 3+ VIEW

[t ankle joint ap left]
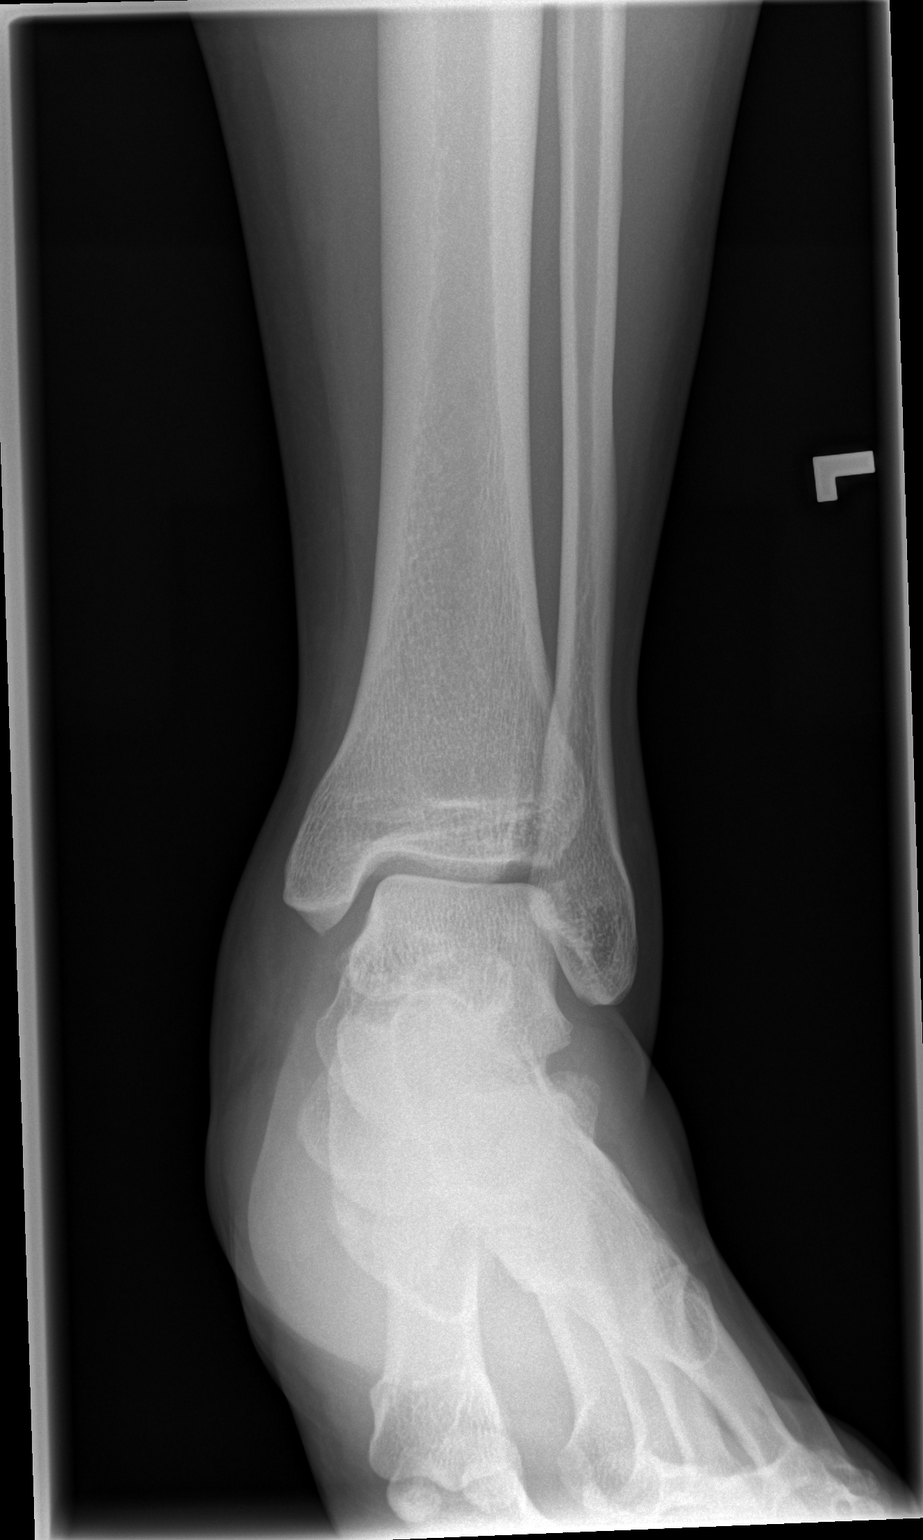

[t ankle joint oblique left]
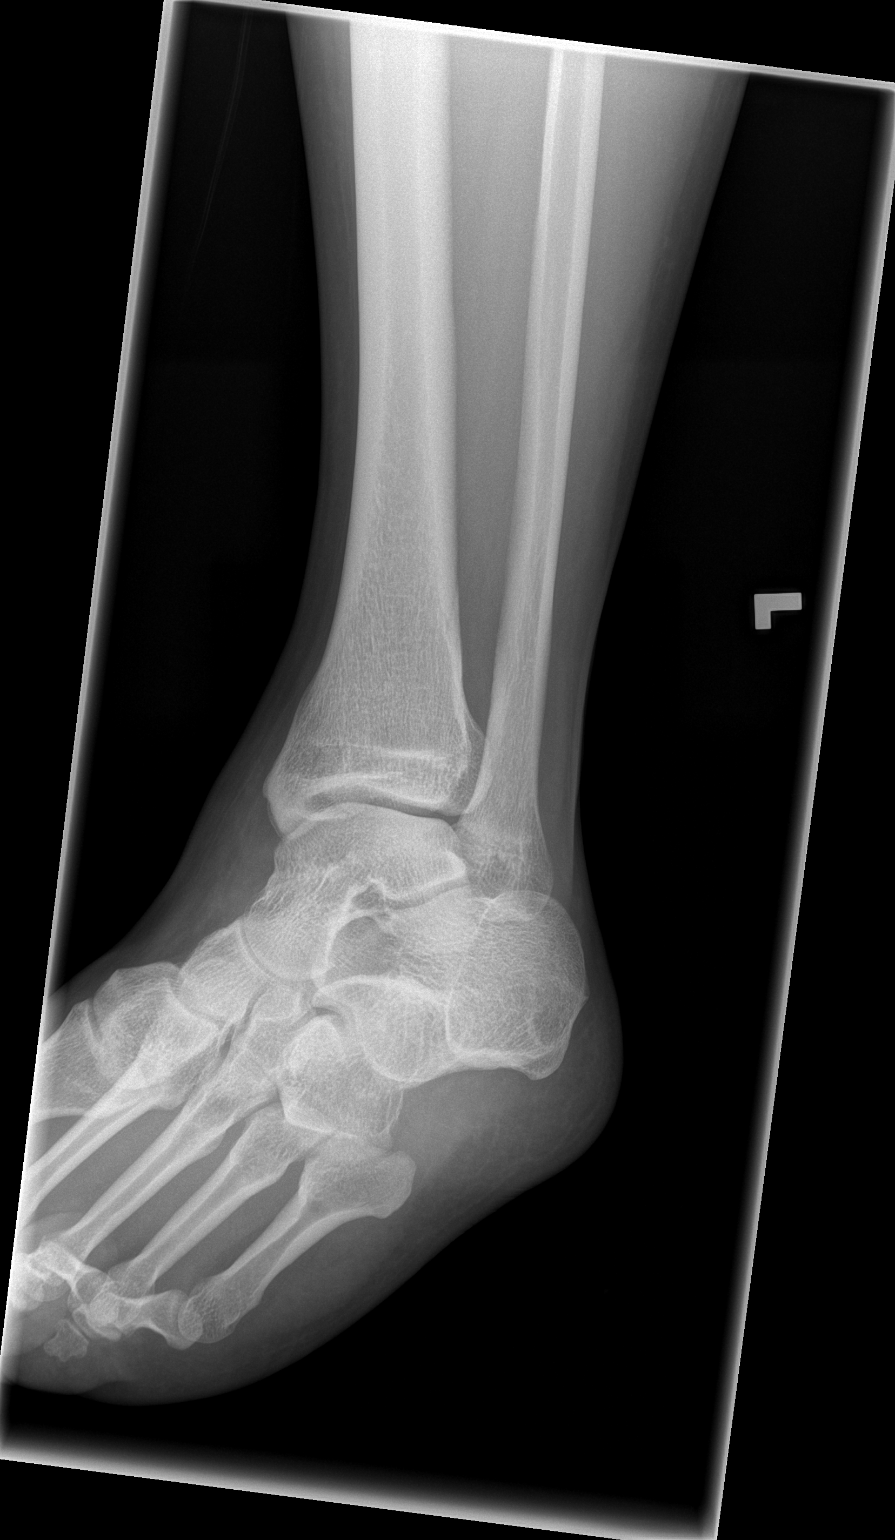

[t ankle joint lat left]
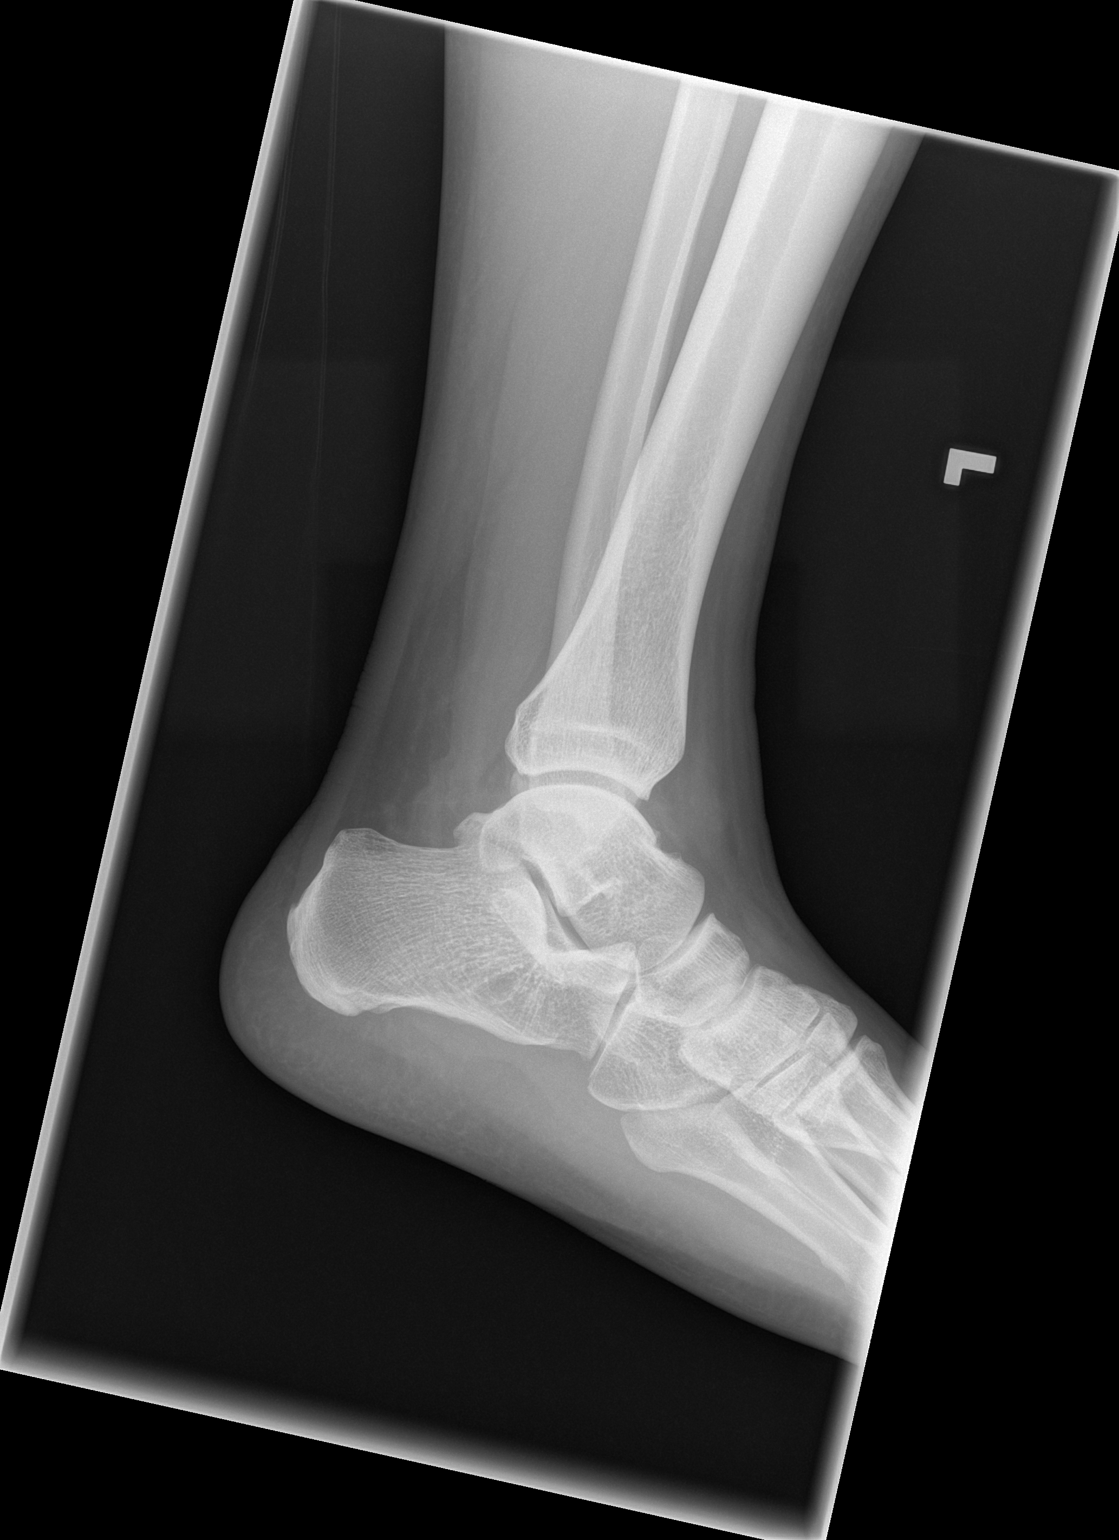

[3 of 3 positions shown; findings below may reference images not displayed]

FINDINGS: No acute fracture or dislocation. Ankle mortise approximated. Talar
dome intact. Probable small joint effusion noted on lateral view.
Mild soft tissue swelling about the ankle. Osseous mineralization
normal.
IMPRESSION: 1. No acute fracture or dislocation.
2. Small joint effusion with mild soft tissue swelling about the
ankle.
